# Patient Record
Sex: Female | Born: 1955 | Race: White | Hispanic: No | Marital: Married | Smoking: Never smoker
Health system: Southern US, Community
[De-identification: ages and names within clinical notes are randomized; demographics above are authoritative.]

## PROBLEM LIST (undated history)

## (undated) DIAGNOSIS — E119 Type 2 diabetes mellitus without complications: Secondary | ICD-10-CM

## (undated) DIAGNOSIS — I10 Essential (primary) hypertension: Secondary | ICD-10-CM

## (undated) DIAGNOSIS — R42 Dizziness and giddiness: Secondary | ICD-10-CM

## (undated) DIAGNOSIS — M1990 Unspecified osteoarthritis, unspecified site: Secondary | ICD-10-CM

## (undated) DIAGNOSIS — G919 Hydrocephalus, unspecified: Secondary | ICD-10-CM

## (undated) DIAGNOSIS — M545 Low back pain, unspecified: Secondary | ICD-10-CM

## (undated) DIAGNOSIS — G4700 Insomnia, unspecified: Secondary | ICD-10-CM

## (undated) HISTORY — DX: Hydrocephalus, unspecified: G91.9

## (undated) HISTORY — PX: TONSILLECTOMY: SUR1361

## (undated) HISTORY — PX: ECTOPIC PREGNANCY SURGERY: SHX613

## (undated) HISTORY — PX: APPENDECTOMY: SHX54

## (undated) HISTORY — DX: Type 2 diabetes mellitus without complications: E11.9

## (undated) HISTORY — DX: Unspecified osteoarthritis, unspecified site: M19.90

## (undated) HISTORY — PX: CATARACT EXTRACTION: SUR2

## (undated) HISTORY — DX: Essential (primary) hypertension: I10

## (undated) HISTORY — PX: OTHER SURGICAL HISTORY: SHX169

## (undated) HISTORY — PX: ABDOMINAL HYSTERECTOMY: SUR658

## (undated) HISTORY — DX: Dizziness and giddiness: R42

## (undated) HISTORY — PX: OVARIAN CYST REMOVAL: SHX89

## (undated) HISTORY — PX: CHOLECYSTECTOMY: SHX55

## (undated) HISTORY — DX: Low back pain, unspecified: M54.50

## (undated) HISTORY — DX: Low back pain: M54.5

## (undated) HISTORY — DX: Insomnia, unspecified: G47.00

---

## 2017-01-19 ENCOUNTER — Ambulatory Visit: Payer: BLUE CROSS/BLUE SHIELD | Admitting: Diagnostic Neuroimaging

## 2017-01-29 ENCOUNTER — Ambulatory Visit (INDEPENDENT_AMBULATORY_CARE_PROVIDER_SITE_OTHER): Payer: BLUE CROSS/BLUE SHIELD | Admitting: Diagnostic Neuroimaging

## 2017-01-29 ENCOUNTER — Encounter: Payer: Self-pay | Admitting: *Deleted

## 2017-01-29 VITALS — BP 133/75 | HR 69 | Ht 62.0 in | Wt 158.2 lb

## 2017-01-29 DIAGNOSIS — G8929 Other chronic pain: Secondary | ICD-10-CM

## 2017-01-29 DIAGNOSIS — M5442 Lumbago with sciatica, left side: Secondary | ICD-10-CM | POA: Diagnosis not present

## 2017-01-29 DIAGNOSIS — M5441 Lumbago with sciatica, right side: Secondary | ICD-10-CM
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
## 2017-01-29 NOTE — Patient Instructions (Signed)
Thank you for coming to see [place] at [facility]. I hope we have been able to provide you high quality care today.  You may receive a patient satisfaction survey over the next few weeks. We would appreciate your feedback and comments so that we may continue to improve ourselves and the health of our patients.  - I will check MRI lumbar spine  - consider physical therapy   ~~~~~~~~~~~~~~~~~~~~~~~~~~~~~~~~~~~~~~~~~~~~~~~~~~~~~~~~~~~~~~~~~  DR. PENUMALLI'S GUIDE TO HAPPY AND HEALTHY LIVING These are some of my general health and wellness recommendations. Some of them may apply to you better than others. Please use common sense as you try these suggestions and feel free to ask me any questions.   ACTIVITY/FITNESS Mental, social, emotional and physical stimulation are very important for brain and body health. Try learning a new activity (arts, music, language, sports, games).  Keep moving your body to the best of your abilities. You can do this at home, inside or outside, the park, community center, gym or anywhere you like. Consider a physical therapist or personal trainer to get started. Consider the app Sworkit. Fitness trackers such as smart-watches, smart-phones or Fitbits can help as well.   NUTRITION Eat more plants: colorful vegetables, nuts, seeds and berries.  Eat less sugar, salt, preservatives and processed foods.  Avoid toxins such as cigarettes and alcohol.  Drink water when you are thirsty. Warm water with a slice of lemon is an excellent morning drink to start the day.  Consider these websites for more information The Nutrition Source (https://www.henry-hernandez.biz/) Precision Nutrition (WindowBlog.ch)   RELAXATION Consider practicing mindfulness meditation or other relaxation techniques such as deep breathing, prayer, yoga, tai chi, massage. See website mindful.org or the apps Headspace or Calm to help get  started.   SLEEP Try to get at least 7-8+ hours sleep per day. Regular exercise and reduced caffeine will help you sleep better. Practice good sleep hygeine techniques. See website sleep.org for more information.   PLANNING Prepare estate planning, living will, healthcare POA documents. Sometimes this is best planned with the help of an attorney. Theconversationproject.org and agingwithdignity.org are excellent resources.

## 2017-02-02 ENCOUNTER — Telehealth: Payer: Self-pay | Admitting: Diagnostic Neuroimaging

## 2017-02-02 NOTE — Telephone Encounter (Signed)
I called the patient back and she wanted to reschedule due to the cost. She is rescheduled for 02/28/17 at the [facility] mobile unit.

## 2017-02-02 NOTE — Telephone Encounter (Signed)
Pt is asking to speak with Raquel Sarna re: her MRI , please call

## 2017-02-07 ENCOUNTER — Other Ambulatory Visit: Payer: BLUE CROSS/BLUE SHIELD

## 2017-02-28 ENCOUNTER — Other Ambulatory Visit: Payer: BLUE CROSS/BLUE SHIELD

## 2017-03-07 DIAGNOSIS — R69 Illness, unspecified: Secondary | ICD-10-CM | POA: Diagnosis not present

## 2017-03-07 DIAGNOSIS — E1165 Type 2 diabetes mellitus with hyperglycemia: Secondary | ICD-10-CM | POA: Diagnosis not present

## 2017-03-07 DIAGNOSIS — R5382 Chronic fatigue, unspecified: Secondary | ICD-10-CM | POA: Diagnosis not present

## 2017-03-07 DIAGNOSIS — Z6826 Body mass index (BMI) 26.0-26.9, adult: Secondary | ICD-10-CM | POA: Diagnosis not present

## 2017-03-07 DIAGNOSIS — Z23 Encounter for immunization: Secondary | ICD-10-CM | POA: Diagnosis not present

## 2017-03-07 DIAGNOSIS — R350 Frequency of micturition: Secondary | ICD-10-CM | POA: Diagnosis not present

## 2017-03-07 DIAGNOSIS — M5442 Lumbago with sciatica, left side: Secondary | ICD-10-CM | POA: Diagnosis not present

## 2017-03-07 DIAGNOSIS — M1990 Unspecified osteoarthritis, unspecified site: Secondary | ICD-10-CM | POA: Diagnosis not present

## 2017-06-07 DIAGNOSIS — Z6826 Body mass index (BMI) 26.0-26.9, adult: Secondary | ICD-10-CM | POA: Diagnosis not present

## 2017-06-07 DIAGNOSIS — M1990 Unspecified osteoarthritis, unspecified site: Secondary | ICD-10-CM | POA: Diagnosis not present

## 2017-06-07 DIAGNOSIS — R5382 Chronic fatigue, unspecified: Secondary | ICD-10-CM | POA: Diagnosis not present

## 2017-06-07 DIAGNOSIS — R69 Illness, unspecified: Secondary | ICD-10-CM | POA: Diagnosis not present

## 2017-06-07 DIAGNOSIS — E1165 Type 2 diabetes mellitus with hyperglycemia: Secondary | ICD-10-CM | POA: Diagnosis not present

## 2017-06-11 ENCOUNTER — Ambulatory Visit: Payer: BLUE CROSS/BLUE SHIELD | Admitting: Diagnostic Neuroimaging

## 2017-09-03 DIAGNOSIS — R42 Dizziness and giddiness: Secondary | ICD-10-CM | POA: Diagnosis not present

## 2017-09-03 DIAGNOSIS — E1165 Type 2 diabetes mellitus with hyperglycemia: Secondary | ICD-10-CM | POA: Diagnosis not present

## 2017-09-03 DIAGNOSIS — I10 Essential (primary) hypertension: Secondary | ICD-10-CM | POA: Diagnosis not present

## 2017-09-03 DIAGNOSIS — R5382 Chronic fatigue, unspecified: Secondary | ICD-10-CM | POA: Diagnosis not present

## 2017-09-03 DIAGNOSIS — E7800 Pure hypercholesterolemia, unspecified: Secondary | ICD-10-CM | POA: Diagnosis not present

## 2017-09-03 DIAGNOSIS — K219 Gastro-esophageal reflux disease without esophagitis: Secondary | ICD-10-CM | POA: Diagnosis not present

## 2017-09-03 DIAGNOSIS — E539 Vitamin B deficiency, unspecified: Secondary | ICD-10-CM | POA: Diagnosis not present

## 2017-09-05 DIAGNOSIS — R42 Dizziness and giddiness: Secondary | ICD-10-CM | POA: Diagnosis not present

## 2017-09-05 DIAGNOSIS — Z0000 Encounter for general adult medical examination without abnormal findings: Secondary | ICD-10-CM | POA: Diagnosis not present

## 2017-09-05 DIAGNOSIS — L299 Pruritus, unspecified: Secondary | ICD-10-CM | POA: Diagnosis not present

## 2017-09-05 DIAGNOSIS — L209 Atopic dermatitis, unspecified: Secondary | ICD-10-CM | POA: Diagnosis not present

## 2017-09-05 DIAGNOSIS — Z6827 Body mass index (BMI) 27.0-27.9, adult: Secondary | ICD-10-CM | POA: Diagnosis not present

## 2017-09-11 DIAGNOSIS — E785 Hyperlipidemia, unspecified: Secondary | ICD-10-CM | POA: Diagnosis not present

## 2017-09-11 DIAGNOSIS — Z1231 Encounter for screening mammogram for malignant neoplasm of breast: Secondary | ICD-10-CM | POA: Diagnosis not present

## 2017-09-11 DIAGNOSIS — I10 Essential (primary) hypertension: Secondary | ICD-10-CM | POA: Diagnosis not present

## 2017-09-11 DIAGNOSIS — J309 Allergic rhinitis, unspecified: Secondary | ICD-10-CM | POA: Diagnosis not present

## 2017-09-11 DIAGNOSIS — G4700 Insomnia, unspecified: Secondary | ICD-10-CM | POA: Diagnosis not present

## 2017-09-11 DIAGNOSIS — R32 Unspecified urinary incontinence: Secondary | ICD-10-CM | POA: Diagnosis not present

## 2017-09-11 DIAGNOSIS — K219 Gastro-esophageal reflux disease without esophagitis: Secondary | ICD-10-CM | POA: Diagnosis not present

## 2017-09-11 DIAGNOSIS — E11620 Type 2 diabetes mellitus with diabetic dermatitis: Secondary | ICD-10-CM | POA: Diagnosis not present

## 2017-09-11 DIAGNOSIS — G8929 Other chronic pain: Secondary | ICD-10-CM | POA: Diagnosis not present

## 2017-09-11 DIAGNOSIS — F419 Anxiety disorder, unspecified: Secondary | ICD-10-CM | POA: Diagnosis not present

## 2017-09-11 DIAGNOSIS — R69 Illness, unspecified: Secondary | ICD-10-CM | POA: Diagnosis not present

## 2017-12-13 DIAGNOSIS — M1990 Unspecified osteoarthritis, unspecified site: Secondary | ICD-10-CM | POA: Diagnosis not present

## 2017-12-13 DIAGNOSIS — M5442 Lumbago with sciatica, left side: Secondary | ICD-10-CM | POA: Diagnosis not present

## 2017-12-13 DIAGNOSIS — E119 Type 2 diabetes mellitus without complications: Secondary | ICD-10-CM | POA: Diagnosis not present

## 2017-12-13 DIAGNOSIS — R5382 Chronic fatigue, unspecified: Secondary | ICD-10-CM | POA: Diagnosis not present

## 2017-12-13 DIAGNOSIS — R42 Dizziness and giddiness: Secondary | ICD-10-CM | POA: Diagnosis not present

## 2017-12-13 DIAGNOSIS — Z6826 Body mass index (BMI) 26.0-26.9, adult: Secondary | ICD-10-CM | POA: Diagnosis not present

## 2017-12-25 DIAGNOSIS — R69 Illness, unspecified: Secondary | ICD-10-CM | POA: Diagnosis not present

## 2017-12-25 DIAGNOSIS — R419 Unspecified symptoms and signs involving cognitive functions and awareness: Secondary | ICD-10-CM | POA: Diagnosis not present

## 2017-12-25 DIAGNOSIS — R42 Dizziness and giddiness: Secondary | ICD-10-CM | POA: Diagnosis not present

## 2017-12-25 DIAGNOSIS — R262 Difficulty in walking, not elsewhere classified: Secondary | ICD-10-CM | POA: Diagnosis not present

## 2017-12-25 DIAGNOSIS — E119 Type 2 diabetes mellitus without complications: Secondary | ICD-10-CM | POA: Diagnosis not present

## 2017-12-27 DIAGNOSIS — R5382 Chronic fatigue, unspecified: Secondary | ICD-10-CM | POA: Diagnosis not present

## 2017-12-27 DIAGNOSIS — R419 Unspecified symptoms and signs involving cognitive functions and awareness: Secondary | ICD-10-CM | POA: Diagnosis not present

## 2017-12-27 DIAGNOSIS — R262 Difficulty in walking, not elsewhere classified: Secondary | ICD-10-CM | POA: Diagnosis not present

## 2017-12-27 DIAGNOSIS — E119 Type 2 diabetes mellitus without complications: Secondary | ICD-10-CM | POA: Diagnosis not present

## 2017-12-27 DIAGNOSIS — Z6826 Body mass index (BMI) 26.0-26.9, adult: Secondary | ICD-10-CM | POA: Diagnosis not present

## 2017-12-27 DIAGNOSIS — R69 Illness, unspecified: Secondary | ICD-10-CM | POA: Diagnosis not present

## 2017-12-27 DIAGNOSIS — M1990 Unspecified osteoarthritis, unspecified site: Secondary | ICD-10-CM | POA: Diagnosis not present

## 2017-12-27 DIAGNOSIS — R42 Dizziness and giddiness: Secondary | ICD-10-CM | POA: Diagnosis not present

## 2018-01-01 DIAGNOSIS — R269 Unspecified abnormalities of gait and mobility: Secondary | ICD-10-CM | POA: Diagnosis not present

## 2018-01-01 DIAGNOSIS — E119 Type 2 diabetes mellitus without complications: Secondary | ICD-10-CM | POA: Diagnosis not present

## 2018-01-01 DIAGNOSIS — R5382 Chronic fatigue, unspecified: Secondary | ICD-10-CM | POA: Diagnosis not present

## 2018-01-01 DIAGNOSIS — Z6828 Body mass index (BMI) 28.0-28.9, adult: Secondary | ICD-10-CM | POA: Diagnosis not present

## 2018-01-01 DIAGNOSIS — M1990 Unspecified osteoarthritis, unspecified site: Secondary | ICD-10-CM | POA: Diagnosis not present

## 2018-01-01 DIAGNOSIS — R42 Dizziness and giddiness: Secondary | ICD-10-CM | POA: Diagnosis not present

## 2018-01-01 DIAGNOSIS — M25561 Pain in right knee: Secondary | ICD-10-CM | POA: Diagnosis not present

## 2018-01-04 DIAGNOSIS — R51 Headache: Secondary | ICD-10-CM | POA: Diagnosis not present

## 2018-01-04 DIAGNOSIS — S0990XA Unspecified injury of head, initial encounter: Secondary | ICD-10-CM | POA: Diagnosis not present

## 2018-01-04 DIAGNOSIS — R42 Dizziness and giddiness: Secondary | ICD-10-CM | POA: Diagnosis not present

## 2018-01-04 DIAGNOSIS — G919 Hydrocephalus, unspecified: Secondary | ICD-10-CM | POA: Diagnosis not present

## 2018-01-04 DIAGNOSIS — R269 Unspecified abnormalities of gait and mobility: Secondary | ICD-10-CM | POA: Diagnosis not present

## 2018-01-04 DIAGNOSIS — R296 Repeated falls: Secondary | ICD-10-CM | POA: Diagnosis not present

## 2018-01-22 ENCOUNTER — Ambulatory Visit: Payer: Medicare HMO | Admitting: Diagnostic Neuroimaging

## 2018-01-22 ENCOUNTER — Encounter: Payer: Self-pay | Admitting: Diagnostic Neuroimaging

## 2018-01-22 ENCOUNTER — Telehealth: Payer: Self-pay | Admitting: Diagnostic Neuroimaging

## 2018-01-22 VITALS — BP 125/78 | HR 88 | Ht 62.0 in | Wt 161.0 lb

## 2018-01-22 DIAGNOSIS — G3281 Cerebellar ataxia in diseases classified elsewhere: Secondary | ICD-10-CM | POA: Diagnosis not present

## 2018-01-22 DIAGNOSIS — R29898 Other symptoms and signs involving the musculoskeletal system: Secondary | ICD-10-CM | POA: Diagnosis not present

## 2018-01-22 DIAGNOSIS — R269 Unspecified abnormalities of gait and mobility: Secondary | ICD-10-CM | POA: Diagnosis not present

## 2018-01-22 NOTE — Telephone Encounter (Signed)
[employer] medicare order sent to [facility]. They obtain the auth and will reach out to the pt to schedule.  °
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
## 2018-02-05 ENCOUNTER — Ambulatory Visit
Admission: RE | Admit: 2018-02-05 | Discharge: 2018-02-05 | Disposition: A | Discharge status: Home / Self Care | Payer: Medicare HMO | Source: Ambulatory Visit | Attending: Diagnostic Neuroimaging | Admitting: Diagnostic Neuroimaging

## 2018-02-05 DIAGNOSIS — G3281 Cerebellar ataxia in diseases classified elsewhere: Secondary | ICD-10-CM

## 2018-02-07 ENCOUNTER — Telehealth: Payer: Self-pay | Admitting: Diagnostic Neuroimaging

## 2018-02-07 DIAGNOSIS — R269 Unspecified abnormalities of gait and mobility: Secondary | ICD-10-CM

## 2018-02-07 NOTE — Telephone Encounter (Signed)
Mild disc bulging. No major findings. VRP

## 2018-02-07 NOTE — Telephone Encounter (Signed)
Pt request MRI results. She is aware it can take 4 business day for results to be read

## 2018-02-08 NOTE — Telephone Encounter (Signed)
LMVM for her on mobile that her MRI results from Dr. Leta Baptist showed mild disc bulging, no major findings.  She is to call back if questions.

## 2018-02-08 NOTE — Telephone Encounter (Signed)
Patient returned call. Requested to speak with nurse. Please call and advise.

## 2018-02-08 NOTE — Telephone Encounter (Addendum)
I spoke to pt and she is not receiving any therapy at this time.  (she had finished her course that was ordered previously).  She asked what was next?  From the ofv note doing LP if MRI negative.   Is this what you want ordered? Is aware will be addressed next week.

## 2018-02-11 ENCOUNTER — Telehealth: Payer: Self-pay

## 2018-02-11 NOTE — Telephone Encounter (Signed)
Spoke with the patient and she verbalized understanding her results. No questions at this time.

## 2018-02-11 NOTE — Telephone Encounter (Signed)
-----   Message from Penni Bombard, MD sent at 02/07/2018  3:05 PM EDT ----- Mild disc bulging. Overall unremarkable imaging results. Please call patient. Continue current plan. Conservative mgmt for pain. -VRP

## 2018-02-12 DIAGNOSIS — J019 Acute sinusitis, unspecified: Secondary | ICD-10-CM | POA: Diagnosis not present

## 2018-02-12 DIAGNOSIS — R69 Illness, unspecified: Secondary | ICD-10-CM | POA: Diagnosis not present

## 2018-02-12 DIAGNOSIS — Z6826 Body mass index (BMI) 26.0-26.9, adult: Secondary | ICD-10-CM | POA: Diagnosis not present

## 2018-02-12 DIAGNOSIS — E119 Type 2 diabetes mellitus without complications: Secondary | ICD-10-CM | POA: Diagnosis not present

## 2018-02-12 DIAGNOSIS — R5382 Chronic fatigue, unspecified: Secondary | ICD-10-CM | POA: Diagnosis not present

## 2018-02-13 NOTE — Telephone Encounter (Signed)
I spoke to pt and relayed that Dr. Leta Baptist ordered an LP for her to have done at [facility] for check pressure and depending my remove some CSF.  She is to see how she walks 1-2 days after this procedure and then let us know.   This would help with diagnosis.  She verbalized understanding. If did not hear about appt in a week to call us back.  She said she would.

## 2018-02-13 NOTE — Telephone Encounter (Signed)
I will order large volume LP. -VRP

## 2018-02-13 NOTE — Addendum Note (Signed)
Addended by: Andrey Spearman R on: 02/13/2018 03:12 PM   Modules accepted: Orders

## 2018-02-28 ENCOUNTER — Ambulatory Visit
Admission: RE | Admit: 2018-02-28 | Discharge: 2018-02-28 | Disposition: A | Discharge status: Home / Self Care | Payer: Medicare HMO | Source: Ambulatory Visit | Attending: Diagnostic Neuroimaging | Admitting: Diagnostic Neuroimaging

## 2018-02-28 VITALS — BP 128/58 | HR 64

## 2018-02-28 DIAGNOSIS — G912 (Idiopathic) normal pressure hydrocephalus: Secondary | ICD-10-CM | POA: Diagnosis not present

## 2018-02-28 DIAGNOSIS — R269 Unspecified abnormalities of gait and mobility: Secondary | ICD-10-CM | POA: Diagnosis not present

## 2018-02-28 NOTE — Discharge Instructions (Signed)

## 2018-03-04 ENCOUNTER — Telehealth: Payer: Self-pay | Admitting: *Deleted

## 2018-03-04 LAB — GLUCOSE, CSF: Glucose, CSF: 87 mg/dL — ABNORMAL HIGH (ref 40–80)

## 2018-03-04 LAB — CSF CELL COUNT WITH DIFFERENTIAL
RBC COUNT CSF: 1 {cells}/uL (ref 0–10)
WBC CSF: 1 {cells}/uL (ref 0–5)

## 2018-03-04 LAB — CSF CULTURE W GRAM STAIN: Result:: NO GROWTH

## 2018-03-04 LAB — CSF CULTURE
MICRO NUMBER: 91158398
SPECIMEN QUALITY: ADEQUATE

## 2018-03-04 LAB — PROTEIN, CSF: Total Protein, CSF: 34 mg/dL (ref 15–60)

## 2018-03-04 NOTE — Telephone Encounter (Signed)
Pts husband Paul(on DPR) requesting a call, stating he would like the speak with CMA(brittnay) and discuss when the pt is needed for a f/u appt. Please advise

## 2018-03-04 NOTE — Telephone Encounter (Signed)
Spoke with Kimberly Allen and she stated that her symptoms have improved a little. No other changes since the lumbar puncture was done.

## 2018-03-04 NOTE — Telephone Encounter (Signed)
Spoke to pt and asked her how she felt she was walking after her LP that she had on 02-28-18.  She states she was some better.   I asked her percentage what she thought she was and she stated 30% better.  She had positional headache first day, but then has been fine since.  Was trying to vacuum this am and relayed that she got so tired so quickly.

## 2018-03-04 NOTE — Telephone Encounter (Signed)
Noted. Monitor for next 1-2 weeks. -VRP

## 2018-03-04 NOTE — Telephone Encounter (Signed)
please find out if any symptoms have improved after the lumbar puncture. Then options would be possible referral vs conservative mgmt. -VRP

## 2018-03-04 NOTE — Telephone Encounter (Signed)
Patient doesn't have a pending appointment at present. When should she f/u? Please advise.

## 2018-03-04 NOTE — Telephone Encounter (Signed)
Spoke with patient and advised her Dr Kimberly Allen wants her to monitor her symptoms over the next 1-2 weeks. Advised she call for any concerns, questions. She verbalized understanding, appreciation.

## 2018-03-11 ENCOUNTER — Telehealth: Payer: Self-pay | Admitting: *Deleted

## 2018-03-11 DIAGNOSIS — Z6827 Body mass index (BMI) 27.0-27.9, adult: Secondary | ICD-10-CM | POA: Diagnosis not present

## 2018-03-11 DIAGNOSIS — E119 Type 2 diabetes mellitus without complications: Secondary | ICD-10-CM | POA: Diagnosis not present

## 2018-03-11 DIAGNOSIS — Z23 Encounter for immunization: Secondary | ICD-10-CM | POA: Diagnosis not present

## 2018-03-11 DIAGNOSIS — R5382 Chronic fatigue, unspecified: Secondary | ICD-10-CM | POA: Diagnosis not present

## 2018-03-11 DIAGNOSIS — R69 Illness, unspecified: Secondary | ICD-10-CM | POA: Diagnosis not present

## 2018-03-11 DIAGNOSIS — M545 Low back pain: Secondary | ICD-10-CM | POA: Diagnosis not present

## 2018-03-11 NOTE — Telephone Encounter (Signed)
CSF results were benign; please update pt.

## 2018-03-11 NOTE — Telephone Encounter (Signed)
Spoke to patient and informed her that per Dr Rexene Alberts, her LP CSF lab results are benign. Advised she continue with conservative management with PT exercises she was taught. She stated she is not feeling as weak now, denied falling. She stated sometimes when she gets up she feels unsteady, but it improves after she is up. This RN advised she use caution and take extra time before rising, be sure her legs and feet are well positioned before rising. Use her walker on days she feels weak or doesn't feel well in general. She verbalized understanding, appreciation.

## 2018-03-14 NOTE — Telephone Encounter (Signed)
May setup follow up appt in 3 months. -VRP

## 2018-03-14 NOTE — Telephone Encounter (Signed)
Ms. Faro has been scheduled for 07/02/2018 at 9 am.

## 2018-06-24 DIAGNOSIS — R69 Illness, unspecified: Secondary | ICD-10-CM | POA: Diagnosis not present

## 2018-06-24 DIAGNOSIS — M545 Low back pain: Secondary | ICD-10-CM | POA: Diagnosis not present

## 2018-06-24 DIAGNOSIS — R5382 Chronic fatigue, unspecified: Secondary | ICD-10-CM | POA: Diagnosis not present

## 2018-06-24 DIAGNOSIS — Z6827 Body mass index (BMI) 27.0-27.9, adult: Secondary | ICD-10-CM | POA: Diagnosis not present

## 2018-06-24 DIAGNOSIS — E119 Type 2 diabetes mellitus without complications: Secondary | ICD-10-CM | POA: Diagnosis not present

## 2018-07-02 ENCOUNTER — Ambulatory Visit: Payer: Medicare HMO | Admitting: Diagnostic Neuroimaging

## 2018-07-02 ENCOUNTER — Encounter: Payer: Self-pay | Admitting: Diagnostic Neuroimaging

## 2018-07-02 VITALS — BP 136/82 | HR 89 | Ht 62.0 in | Wt 157.2 lb

## 2018-07-02 DIAGNOSIS — R269 Unspecified abnormalities of gait and mobility: Secondary | ICD-10-CM

## 2018-07-02 DIAGNOSIS — G9389 Other specified disorders of brain: Secondary | ICD-10-CM | POA: Diagnosis not present
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
## 2018-08-20 DIAGNOSIS — R5382 Chronic fatigue, unspecified: Secondary | ICD-10-CM | POA: Diagnosis not present

## 2018-08-20 DIAGNOSIS — Z6827 Body mass index (BMI) 27.0-27.9, adult: Secondary | ICD-10-CM | POA: Diagnosis not present

## 2018-08-20 DIAGNOSIS — M545 Low back pain: Secondary | ICD-10-CM | POA: Diagnosis not present

## 2018-08-20 DIAGNOSIS — R69 Illness, unspecified: Secondary | ICD-10-CM | POA: Diagnosis not present

## 2018-08-20 DIAGNOSIS — E119 Type 2 diabetes mellitus without complications: Secondary | ICD-10-CM | POA: Diagnosis not present

## 2018-09-19 DIAGNOSIS — L723 Sebaceous cyst: Secondary | ICD-10-CM | POA: Diagnosis not present

## 2018-09-19 DIAGNOSIS — R69 Illness, unspecified: Secondary | ICD-10-CM | POA: Diagnosis not present

## 2018-09-19 DIAGNOSIS — Z6827 Body mass index (BMI) 27.0-27.9, adult: Secondary | ICD-10-CM | POA: Diagnosis not present

## 2018-09-19 DIAGNOSIS — R5382 Chronic fatigue, unspecified: Secondary | ICD-10-CM | POA: Diagnosis not present

## 2018-09-19 DIAGNOSIS — M1990 Unspecified osteoarthritis, unspecified site: Secondary | ICD-10-CM | POA: Diagnosis not present

## 2018-09-19 DIAGNOSIS — E119 Type 2 diabetes mellitus without complications: Secondary | ICD-10-CM | POA: Diagnosis not present

## 2018-09-19 DIAGNOSIS — M545 Low back pain: Secondary | ICD-10-CM | POA: Diagnosis not present

## 2018-09-26 DIAGNOSIS — H9202 Otalgia, left ear: Secondary | ICD-10-CM | POA: Diagnosis not present

## 2018-09-26 DIAGNOSIS — J309 Allergic rhinitis, unspecified: Secondary | ICD-10-CM | POA: Diagnosis not present

## 2018-09-26 DIAGNOSIS — L723 Sebaceous cyst: Secondary | ICD-10-CM | POA: Diagnosis not present

## 2018-09-26 DIAGNOSIS — Z6827 Body mass index (BMI) 27.0-27.9, adult: Secondary | ICD-10-CM | POA: Diagnosis not present

## 2018-11-07 IMAGING — XA DG FLUORO GUIDE LUMBAR PUNCTURE
1 series · 1 of 1 positions shown · non-contrast
Comparison: none

CLINICAL DATA: Normal pressure hydrocephalus

[Series 1: ortho standard · 1 of 1 slices shown]
[im 1/1]
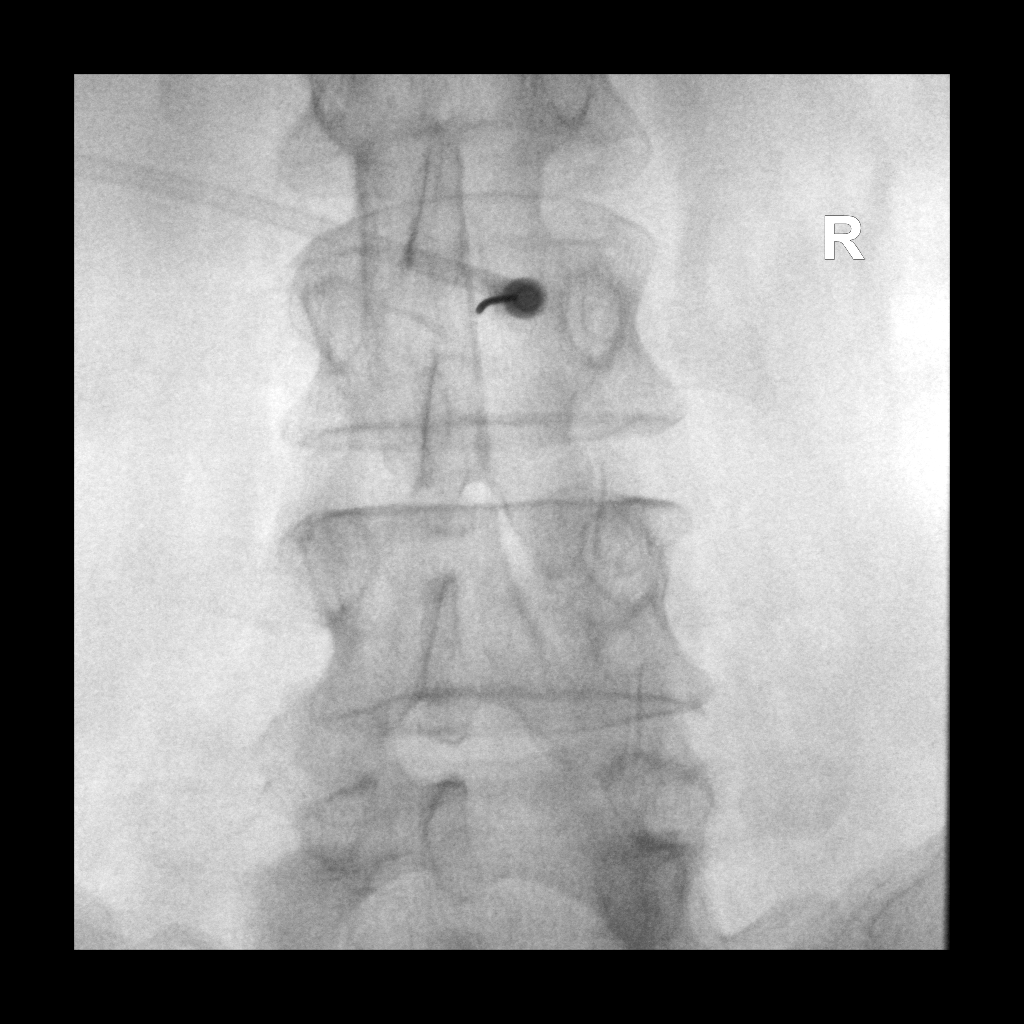

[1 of 1 positions shown; findings below may reference images not displayed]

EXAM:
DIAGNOSTIC LUMBAR PUNCTURE UNDER FLUOROSCOPIC GUIDANCE

FLUOROSCOPY TIME:  21 seconds

PROCEDURE:
Informed consent was obtained from the patient prior to the
procedure, including potential complications of headache, allergy,
and pain. With the patient prone, the lower back was prepped with
Betadine. 1% Lidocaine was used for local anesthesia. Lumbar
puncture was performed at the L2-3 level using a 20 gauge needle
with return of clear CSF with an opening pressure of 23 cm water.
Thirtyml of CSF were obtained for laboratory studies. The patient
tolerated the procedure well.

COMPLICATIONS:
None
IMPRESSION: Successful large volume lumbar puncture.

## 2018-12-24 DIAGNOSIS — R69 Illness, unspecified: Secondary | ICD-10-CM | POA: Diagnosis not present

## 2018-12-24 DIAGNOSIS — Z6827 Body mass index (BMI) 27.0-27.9, adult: Secondary | ICD-10-CM | POA: Diagnosis not present

## 2018-12-24 DIAGNOSIS — R5382 Chronic fatigue, unspecified: Secondary | ICD-10-CM | POA: Diagnosis not present

## 2018-12-24 DIAGNOSIS — E119 Type 2 diabetes mellitus without complications: Secondary | ICD-10-CM | POA: Diagnosis not present

## 2018-12-24 DIAGNOSIS — M1990 Unspecified osteoarthritis, unspecified site: Secondary | ICD-10-CM | POA: Diagnosis not present

## 2019-01-27 DIAGNOSIS — R69 Illness, unspecified: Secondary | ICD-10-CM | POA: Diagnosis not present

## 2019-01-27 DIAGNOSIS — Z6827 Body mass index (BMI) 27.0-27.9, adult: Secondary | ICD-10-CM | POA: Diagnosis not present

## 2019-01-27 DIAGNOSIS — D489 Neoplasm of uncertain behavior, unspecified: Secondary | ICD-10-CM | POA: Diagnosis not present

## 2019-02-25 DIAGNOSIS — Z23 Encounter for immunization: Secondary | ICD-10-CM | POA: Diagnosis not present

## 2019-02-27 DIAGNOSIS — D485 Neoplasm of uncertain behavior of skin: Secondary | ICD-10-CM | POA: Diagnosis not present

## 2019-02-27 DIAGNOSIS — L821 Other seborrheic keratosis: Secondary | ICD-10-CM | POA: Diagnosis not present

## 2019-02-27 DIAGNOSIS — L820 Inflamed seborrheic keratosis: Secondary | ICD-10-CM | POA: Diagnosis not present

## 2019-02-27 DIAGNOSIS — L570 Actinic keratosis: Secondary | ICD-10-CM | POA: Diagnosis not present

## 2019-02-27 DIAGNOSIS — L819 Disorder of pigmentation, unspecified: Secondary | ICD-10-CM | POA: Diagnosis not present

## 2019-03-03 DIAGNOSIS — M25561 Pain in right knee: Secondary | ICD-10-CM | POA: Diagnosis not present

## 2019-03-03 DIAGNOSIS — Z6827 Body mass index (BMI) 27.0-27.9, adult: Secondary | ICD-10-CM | POA: Diagnosis not present

## 2019-03-03 DIAGNOSIS — R51 Headache: Secondary | ICD-10-CM | POA: Diagnosis not present

## 2019-03-03 DIAGNOSIS — R42 Dizziness and giddiness: Secondary | ICD-10-CM | POA: Diagnosis not present

## 2019-03-24 DIAGNOSIS — R42 Dizziness and giddiness: Secondary | ICD-10-CM | POA: Diagnosis not present

## 2019-03-24 DIAGNOSIS — M1990 Unspecified osteoarthritis, unspecified site: Secondary | ICD-10-CM | POA: Diagnosis not present

## 2019-03-24 DIAGNOSIS — Z6828 Body mass index (BMI) 28.0-28.9, adult: Secondary | ICD-10-CM | POA: Diagnosis not present

## 2019-03-24 DIAGNOSIS — R69 Illness, unspecified: Secondary | ICD-10-CM | POA: Diagnosis not present

## 2019-03-24 DIAGNOSIS — I10 Essential (primary) hypertension: Secondary | ICD-10-CM | POA: Diagnosis not present

## 2019-03-24 DIAGNOSIS — E1165 Type 2 diabetes mellitus with hyperglycemia: Secondary | ICD-10-CM | POA: Diagnosis not present

## 2019-06-19 DIAGNOSIS — M1990 Unspecified osteoarthritis, unspecified site: Secondary | ICD-10-CM | POA: Diagnosis not present

## 2019-06-19 DIAGNOSIS — I10 Essential (primary) hypertension: Secondary | ICD-10-CM | POA: Diagnosis not present

## 2019-06-19 DIAGNOSIS — R42 Dizziness and giddiness: Secondary | ICD-10-CM | POA: Diagnosis not present

## 2019-06-19 DIAGNOSIS — R69 Illness, unspecified: Secondary | ICD-10-CM | POA: Diagnosis not present

## 2019-06-19 DIAGNOSIS — Z6828 Body mass index (BMI) 28.0-28.9, adult: Secondary | ICD-10-CM | POA: Diagnosis not present

## 2019-06-19 DIAGNOSIS — E1165 Type 2 diabetes mellitus with hyperglycemia: Secondary | ICD-10-CM | POA: Diagnosis not present

## 2019-08-04 DIAGNOSIS — R5382 Chronic fatigue, unspecified: Secondary | ICD-10-CM | POA: Diagnosis not present

## 2019-08-04 DIAGNOSIS — Z6828 Body mass index (BMI) 28.0-28.9, adult: Secondary | ICD-10-CM | POA: Diagnosis not present

## 2019-08-04 DIAGNOSIS — R42 Dizziness and giddiness: Secondary | ICD-10-CM | POA: Diagnosis not present

## 2019-08-04 DIAGNOSIS — E1165 Type 2 diabetes mellitus with hyperglycemia: Secondary | ICD-10-CM | POA: Diagnosis not present

## 2019-08-21 DIAGNOSIS — R030 Elevated blood-pressure reading, without diagnosis of hypertension: Secondary | ICD-10-CM | POA: Diagnosis not present

## 2019-08-21 DIAGNOSIS — E785 Hyperlipidemia, unspecified: Secondary | ICD-10-CM | POA: Diagnosis not present

## 2019-08-21 DIAGNOSIS — G8929 Other chronic pain: Secondary | ICD-10-CM | POA: Diagnosis not present

## 2019-08-21 DIAGNOSIS — I509 Heart failure, unspecified: Secondary | ICD-10-CM | POA: Diagnosis not present

## 2019-08-21 DIAGNOSIS — M1990 Unspecified osteoarthritis, unspecified site: Secondary | ICD-10-CM | POA: Diagnosis not present

## 2019-08-21 DIAGNOSIS — I739 Peripheral vascular disease, unspecified: Secondary | ICD-10-CM | POA: Diagnosis not present

## 2019-08-21 DIAGNOSIS — E1136 Type 2 diabetes mellitus with diabetic cataract: Secondary | ICD-10-CM | POA: Diagnosis not present

## 2019-08-21 DIAGNOSIS — R69 Illness, unspecified: Secondary | ICD-10-CM | POA: Diagnosis not present

## 2019-08-21 DIAGNOSIS — K219 Gastro-esophageal reflux disease without esophagitis: Secondary | ICD-10-CM | POA: Diagnosis not present

## 2019-09-16 DIAGNOSIS — E119 Type 2 diabetes mellitus without complications: Secondary | ICD-10-CM | POA: Diagnosis not present

## 2019-09-16 DIAGNOSIS — R5382 Chronic fatigue, unspecified: Secondary | ICD-10-CM | POA: Diagnosis not present

## 2019-09-16 DIAGNOSIS — I10 Essential (primary) hypertension: Secondary | ICD-10-CM | POA: Diagnosis not present

## 2019-09-16 DIAGNOSIS — Z0000 Encounter for general adult medical examination without abnormal findings: Secondary | ICD-10-CM | POA: Diagnosis not present

## 2019-09-16 DIAGNOSIS — E559 Vitamin D deficiency, unspecified: Secondary | ICD-10-CM | POA: Diagnosis not present

## 2019-09-16 DIAGNOSIS — Z6828 Body mass index (BMI) 28.0-28.9, adult: Secondary | ICD-10-CM | POA: Diagnosis not present

## 2019-09-16 DIAGNOSIS — R42 Dizziness and giddiness: Secondary | ICD-10-CM | POA: Diagnosis not present

## 2019-09-16 DIAGNOSIS — E539 Vitamin B deficiency, unspecified: Secondary | ICD-10-CM | POA: Diagnosis not present

## 2019-09-17 DIAGNOSIS — E1165 Type 2 diabetes mellitus with hyperglycemia: Secondary | ICD-10-CM | POA: Diagnosis not present

## 2019-09-26 DIAGNOSIS — R42 Dizziness and giddiness: Secondary | ICD-10-CM | POA: Diagnosis not present

## 2019-09-26 DIAGNOSIS — Z6827 Body mass index (BMI) 27.0-27.9, adult: Secondary | ICD-10-CM | POA: Diagnosis not present

## 2019-10-10 DIAGNOSIS — Z6827 Body mass index (BMI) 27.0-27.9, adult: Secondary | ICD-10-CM | POA: Diagnosis not present

## 2019-10-10 DIAGNOSIS — M25561 Pain in right knee: Secondary | ICD-10-CM | POA: Diagnosis not present

## 2019-10-10 DIAGNOSIS — M1990 Unspecified osteoarthritis, unspecified site: Secondary | ICD-10-CM | POA: Diagnosis not present

## 2019-10-10 DIAGNOSIS — R269 Unspecified abnormalities of gait and mobility: Secondary | ICD-10-CM | POA: Diagnosis not present

## 2019-10-28 DIAGNOSIS — H903 Sensorineural hearing loss, bilateral: Secondary | ICD-10-CM | POA: Diagnosis not present

## 2019-10-28 DIAGNOSIS — R42 Dizziness and giddiness: Secondary | ICD-10-CM | POA: Diagnosis not present

## 2019-12-16 DIAGNOSIS — R42 Dizziness and giddiness: Secondary | ICD-10-CM | POA: Diagnosis not present

## 2019-12-16 DIAGNOSIS — M1990 Unspecified osteoarthritis, unspecified site: Secondary | ICD-10-CM | POA: Diagnosis not present

## 2019-12-16 DIAGNOSIS — I10 Essential (primary) hypertension: Secondary | ICD-10-CM | POA: Diagnosis not present

## 2019-12-16 DIAGNOSIS — Z6827 Body mass index (BMI) 27.0-27.9, adult: Secondary | ICD-10-CM | POA: Diagnosis not present

## 2019-12-29 DIAGNOSIS — R42 Dizziness and giddiness: Secondary | ICD-10-CM | POA: Diagnosis not present

## 2020-01-06 DIAGNOSIS — E119 Type 2 diabetes mellitus without complications: Secondary | ICD-10-CM | POA: Diagnosis not present

## 2020-01-07 DIAGNOSIS — R42 Dizziness and giddiness: Secondary | ICD-10-CM | POA: Diagnosis not present

## 2020-01-07 DIAGNOSIS — H903 Sensorineural hearing loss, bilateral: Secondary | ICD-10-CM | POA: Diagnosis not present

## 2020-01-19 DIAGNOSIS — M810 Age-related osteoporosis without current pathological fracture: Secondary | ICD-10-CM | POA: Diagnosis not present

## 2020-01-19 DIAGNOSIS — Z0389 Encounter for observation for other suspected diseases and conditions ruled out: Secondary | ICD-10-CM | POA: Diagnosis not present

## 2020-02-05 ENCOUNTER — Ambulatory Visit: Payer: Medicare HMO | Admitting: Physical Therapy

## 2020-02-24 ENCOUNTER — Ambulatory Visit: Payer: Medicare HMO | Attending: Otolaryngology | Admitting: Physical Therapy

## 2020-03-24 DIAGNOSIS — Z6828 Body mass index (BMI) 28.0-28.9, adult: Secondary | ICD-10-CM | POA: Diagnosis not present

## 2020-03-24 DIAGNOSIS — M1990 Unspecified osteoarthritis, unspecified site: Secondary | ICD-10-CM | POA: Diagnosis not present

## 2020-03-24 DIAGNOSIS — R42 Dizziness and giddiness: Secondary | ICD-10-CM | POA: Diagnosis not present

## 2020-03-24 DIAGNOSIS — Z23 Encounter for immunization: Secondary | ICD-10-CM | POA: Diagnosis not present

## 2020-03-24 DIAGNOSIS — M5432 Sciatica, left side: Secondary | ICD-10-CM | POA: Diagnosis not present

## 2020-05-04 DIAGNOSIS — I10 Essential (primary) hypertension: Secondary | ICD-10-CM | POA: Diagnosis not present

## 2020-05-04 DIAGNOSIS — Z7984 Long term (current) use of oral hypoglycemic drugs: Secondary | ICD-10-CM | POA: Diagnosis not present

## 2020-05-04 DIAGNOSIS — E1165 Type 2 diabetes mellitus with hyperglycemia: Secondary | ICD-10-CM | POA: Diagnosis not present

## 2021-04-25 ENCOUNTER — Encounter: Payer: Self-pay | Admitting: Internal Medicine

## 2021-08-30 NOTE — Progress Notes (Deleted)
? ? ? ?GI Office Note   ? ?Referring Provider: Denny Levy, PA ?Primary Care Physician:  Denny Levy, [place]  ?Primary Gastroenterologist: ? ?Chief Complaint  ? ?No chief complaint on file. ? ? ? ?History of Present Illness  ? ?Kimberly Allen is a 66 y.o. female presenting today   ? ? ? ? ? ? ? ?Medications  ? ?Current Outpatient Medications  ?Medication Sig Dispense Refill  ? aspirin EC 81 MG tablet Take 81 mg by mouth daily.    ? cetirizine (ZYRTEC) 10 MG tablet Take 10 mg by mouth daily.    ? esomeprazole (NEXIUM) 20 MG capsule Take 20 mg by mouth daily at 12 noon.    ? FLUoxetine (PROZAC) 20 MG tablet Take 40 mg by mouth daily.    ? HYDROcodone-acetaminophen (NORCO) 7.5-325 MG tablet as needed.  0  ? lisinopril-hydrochlorothiazide (PRINZIDE,ZESTORETIC) 20-12.5 MG tablet 2 (two) times daily. Takes full tab in morning, 1/2 tab at night  0  ? loratadine (CLARITIN) 10 MG tablet Take 10 mg by mouth daily.    ? meclizine (ANTIVERT) 25 MG tablet 25 mg as needed.  0  ? metFORMIN (GLUCOPHAGE) 1000 MG tablet Takes 1/2 tab in morning, full tab at night  3  ? pravastatin (PRAVACHOL) 40 MG tablet 40 mg at bedtime.  1  ? zolpidem (AMBIEN) 5 MG tablet Take 5 mg by mouth at bedtime.    ? ?No current facility-administered medications for this visit.  ? ? ?Allergies  ? ?Allergies as of 08/31/2021 - Review Complete 07/02/2018  ?Allergen Reaction Noted  ? Other  01/29/2017  ? ? ?Past Medical History  ? ?Past Medical History:  ?Diagnosis Date  ? Diabetes ([place])   ? type 2  ? Dizziness and giddiness   ? Hydrocephalus ([place])   ? Hypertension   ? Insomnia   ? Lumbago   ? Osteoarthritis   ? ? ?Past Surgical History  ? ?Past Surgical History:  ?Procedure Laterality Date  ? ABDOMINAL HYSTERECTOMY    ? total  ? APPENDECTOMY    ? carpel tunnel    ? both hands   ? CATARACT EXTRACTION    ? Both eyes  ? CHOLECYSTECTOMY    ? ECTOPIC PREGNANCY SURGERY    ? OVARIAN CYST REMOVAL    ? TONSILLECTOMY    ? ? ?Past Family History  ? ?Family History   ?Problem Relation Age of Onset  ? Heart disease Mother   ?     lung disease  ? Lung disease Mother   ? Heart disease Father   ? Diabetes Father   ? Heart attack Father   ? Diabetes Brother   ? Heart disease Brother   ? Alcohol abuse Sister   ? ? ?Past Social History  ? ?Social History  ? ?Socioeconomic History  ? Marital status: Married  ?  Spouse name: Eddie Dibbles  ? Number of children: 0  ? Years of education: 44  ? Highest education level: Not on file  ?Occupational History  ?  Comment: [employer] [place]  ?Tobacco Use  ? Smoking status: Never  ? Smokeless tobacco: Never  ?Vaping Use  ? Vaping Use: Never used  ?Substance and Sexual Activity  ? Alcohol use: No  ? Drug use: No  ? Sexual activity: Not on file  ?Other Topics Concern  ? Not on file  ?Social History Narrative  ? Lives with husband  ? Caffeine- 1 cup of coffee in the morning   ? ?  Social Determinants of Health  ? ?Financial Resource Strain: Not on file  ?Food Insecurity: Not on file  ?Transportation Needs: Not on file  ?Physical Activity: Not on file  ?Stress: Not on file  ?Social Connections: Not on file  ?Intimate Partner Violence: Not on file  ? ? ?Review of Systems  ? ?General: Negative for anorexia, weight loss, fever, chills, fatigue, weakness. ?Eyes: Negative for vision changes.  ?ENT: Negative for hoarseness, difficulty swallowing , nasal congestion. ?CV: Negative for chest pain, angina, palpitations, dyspnea on exertion, peripheral edema.  ?Respiratory: Negative for dyspnea at rest, dyspnea on exertion, cough, sputum, wheezing.  ?GI: See history of present illness. ?GU:  Negative for dysuria, hematuria, urinary incontinence, urinary frequency, nocturnal urination.  ?MS: Negative for joint pain, low back pain.  ?Derm: Negative for rash or itching.  ?Neuro: Negative for weakness, abnormal sensation, seizure, frequent headaches, memory loss,  ?confusion.  ?Psych: Negative for anxiety, depression, suicidal ideation, hallucinations.  ?Endo: Negative for  unusual weight change.  ?Heme: Negative for bruising or bleeding. ?Allergy: Negative for rash or hives. ? ?Physical Exam  ? ?There were no vitals taken for this visit. ?  ?General: Well-nourished, well-developed in no acute distress.  ?Head: Normocephalic, atraumatic.   ?Eyes: Conjunctiva pink, no icterus. ?Mouth: Oropharyngeal mucosa moist and pink , no lesions erythema or exudate. ?Neck: Supple without thyromegaly, masses, or lymphadenopathy.  ?Lungs: Clear to auscultation bilaterally.  ?Heart: Regular rate and rhythm, no murmurs rubs or gallops.  ?Abdomen: Bowel sounds are normal, nontender, nondistended, no hepatosplenomegaly or masses,  ?no abdominal bruits or hernia, no rebound or guarding.   ?Rectal: *** ?Extremities: No lower extremity edema. No clubbing or deformities.  ?Neuro: Alert and oriented x 4 , grossly normal neurologically.  ?Skin: Warm and dry, no rash or jaundice.   ?Psych: Alert and cooperative, normal mood and affect. ? ?Labs  ? ?03/2021: WBC 6500, Hgb 12.3, Platelets 250,000. Bun 24, glucose 128, tbili 0.5, ap 88, ast 96H, alt 57H, A1C 6.7, HCV Ab 0.9 (neg) ? ?Imaging Studies  ? ?No results found. ? ?Assessment  ? ?  ? ? ?PLAN  ? ?*** ? ? ?Laureen Ochs. Laval Cafaro, MHS, PA-C ?[facility] ? ?

## 2021-08-31 ENCOUNTER — Ambulatory Visit: Payer: Self-pay | Admitting: Gastroenterology

## 2021-08-31 ENCOUNTER — Encounter: Payer: Self-pay | Admitting: Gastroenterology
